# Patient Record
Sex: Male | Born: 1965 | Race: White | Hispanic: No | Marital: Single | Smoking: Current every day smoker
Health system: Southern US, Community
[De-identification: ages and names within clinical notes are randomized; demographics above are authoritative.]

## PROBLEM LIST (undated history)

## (undated) DIAGNOSIS — I10 Essential (primary) hypertension: Secondary | ICD-10-CM

## (undated) DIAGNOSIS — E781 Pure hyperglyceridemia: Secondary | ICD-10-CM

## (undated) DIAGNOSIS — R7303 Prediabetes: Secondary | ICD-10-CM

## (undated) DIAGNOSIS — J189 Pneumonia, unspecified organism: Secondary | ICD-10-CM

## (undated) DIAGNOSIS — I219 Acute myocardial infarction, unspecified: Secondary | ICD-10-CM

## (undated) DIAGNOSIS — I509 Heart failure, unspecified: Secondary | ICD-10-CM

## (undated) DIAGNOSIS — E7800 Pure hypercholesterolemia, unspecified: Secondary | ICD-10-CM

## (undated) HISTORY — DX: Pure hyperglyceridemia: E78.1

## (undated) HISTORY — DX: Pure hypercholesterolemia, unspecified: E78.00

## (undated) HISTORY — PX: SHOULDER SURGERY: SHX246

## (undated) HISTORY — PX: CARDIAC CATHETERIZATION: SHX172

## (undated) HISTORY — DX: Prediabetes: R73.03

## (undated) HISTORY — PX: BACK SURGERY: SHX140

## (undated) HISTORY — DX: Heart failure, unspecified: I50.9

## (undated) HISTORY — PX: OPEN REDUCTION INTERNAL FIXATION (ORIF) TIBIA/FIBULA FRACTURE: SHX5992

---

## 2004-03-13 ENCOUNTER — Ambulatory Visit: Payer: Self-pay | Admitting: Orthopedic Surgery

## 2004-05-09 ENCOUNTER — Encounter: Admission: RE | Admit: 2004-05-09 | Discharge: 2004-05-09 | Payer: Self-pay | Admitting: Neurological Surgery

## 2004-06-04 ENCOUNTER — Ambulatory Visit (HOSPITAL_COMMUNITY): Admission: RE | Admit: 2004-06-04 | Discharge: 2004-06-04 | Payer: Self-pay | Admitting: Neurological Surgery

## 2004-06-04 IMAGING — CT CT CHEST W/ CM
1 series · 16 of 31 positions shown, 20 images · IV contrast ([ID] [id number])
Comparison: none

CLINICAL DATA: Right upper lobe nodule on recent chest radiograph.

[Series 2: routine chest · axial · 0.75mm/px · z∈[-387,-82]mm · 16 of 67 slices shown, 20 images]
[im 3/67  mediastinal]
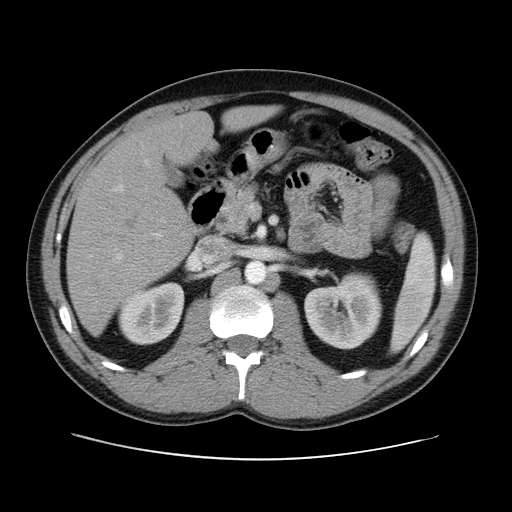
[im 3/67  lung]
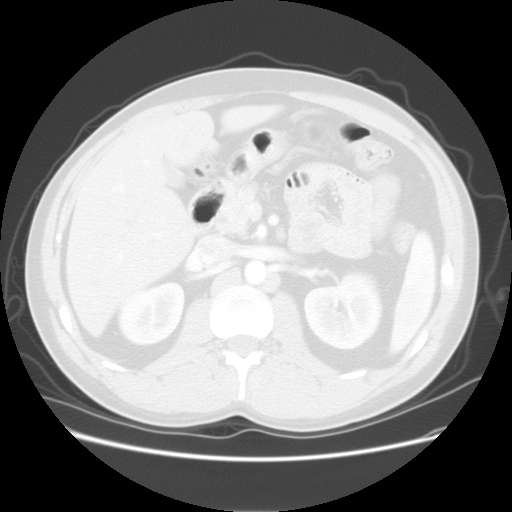
[im 8/67  lung]
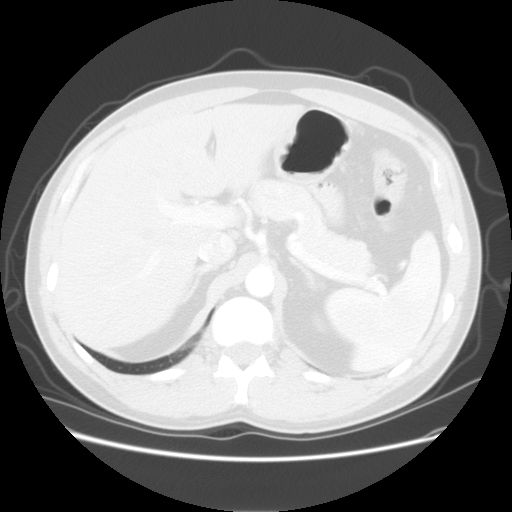
[im 13/67  lung]
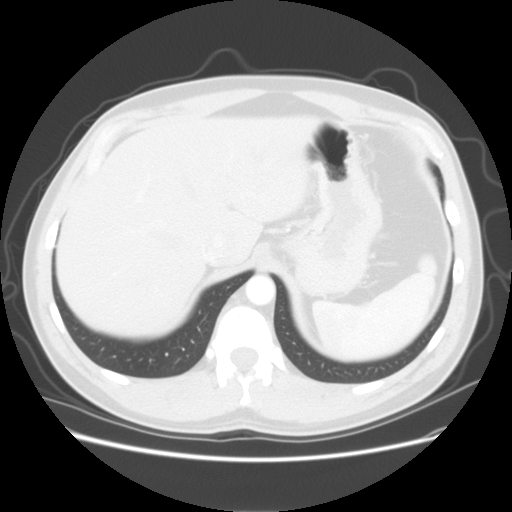
[im 15/67  lung]
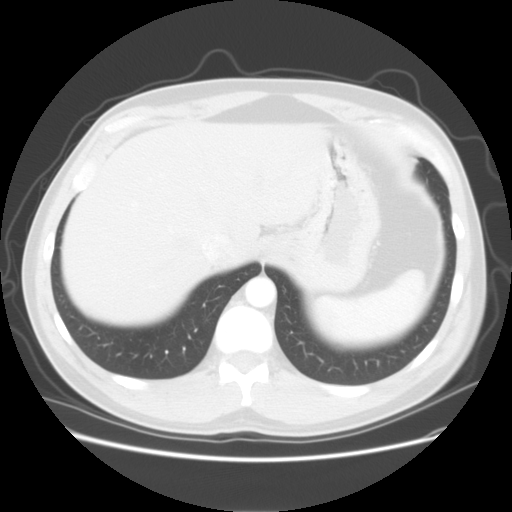
[im 20/67  mediastinal]
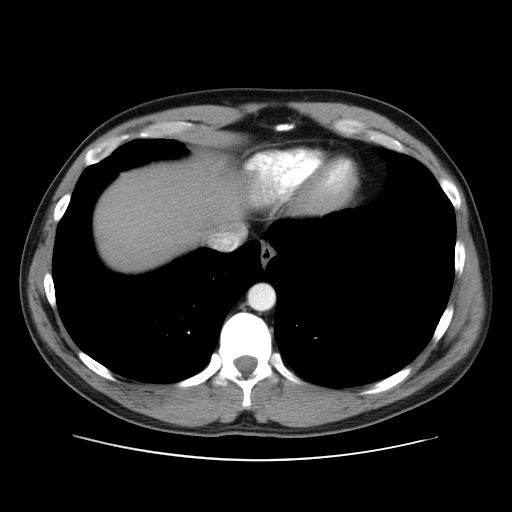
[im 20/67  lung]
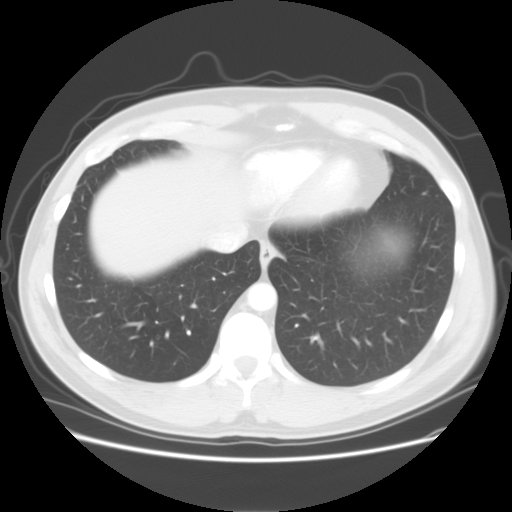
[im 23/67  lung]
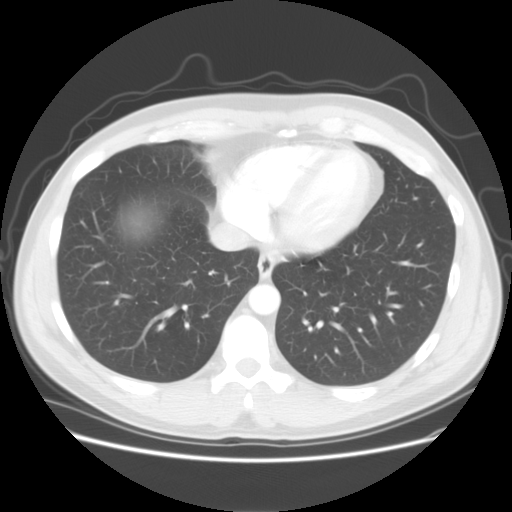
[im 27/67  lung]
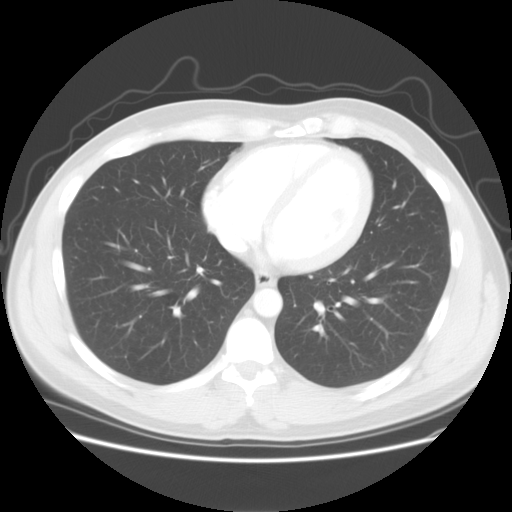
[im 32/67  lung]
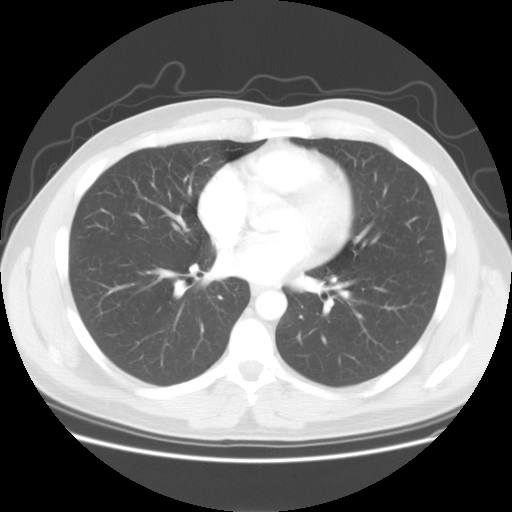
[im 36/67  mediastinal]
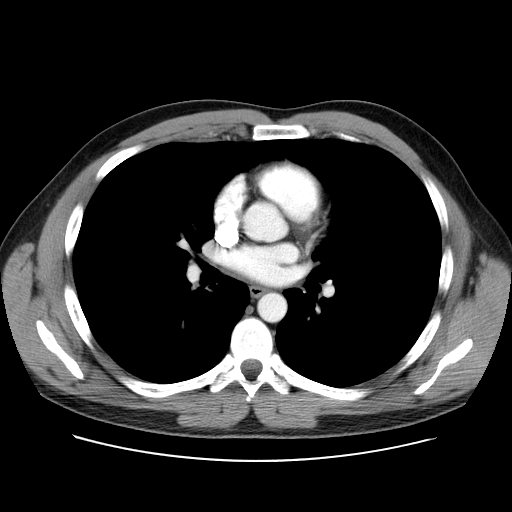
[im 36/67  lung]
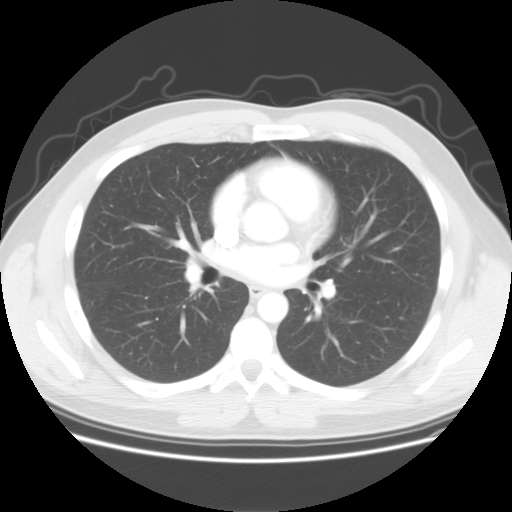
[im 40/67  lung]
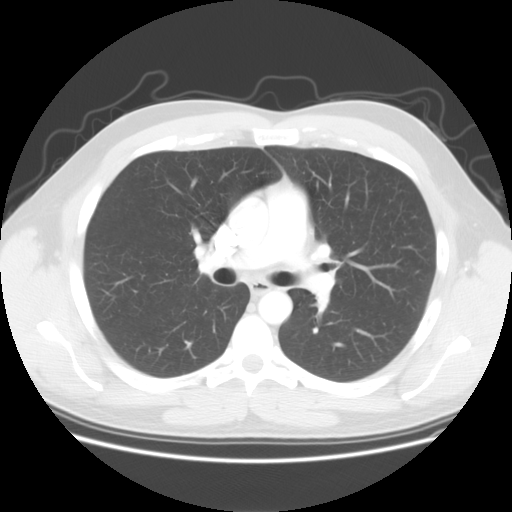
[im 45/67  lung]
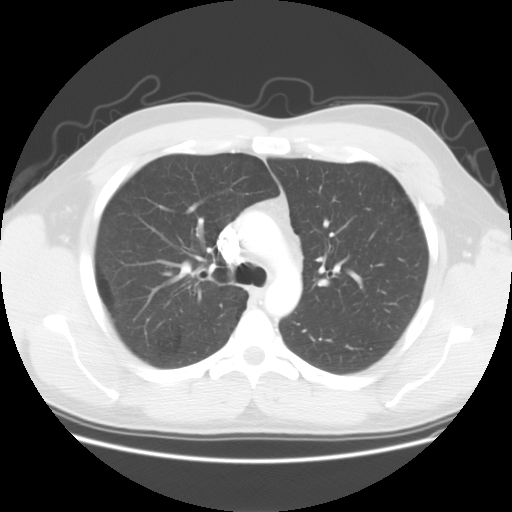
[im 47/67  lung]
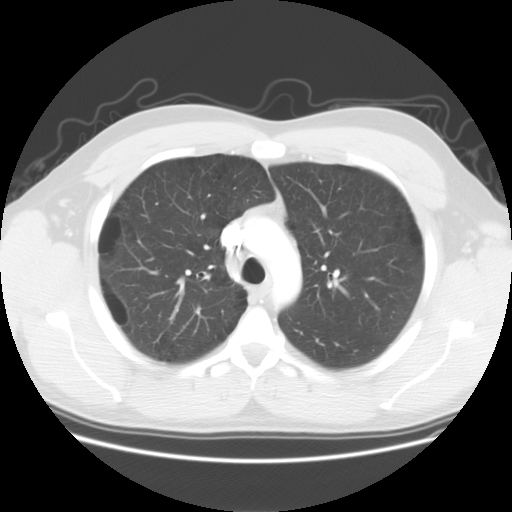
[im 52/67  mediastinal]
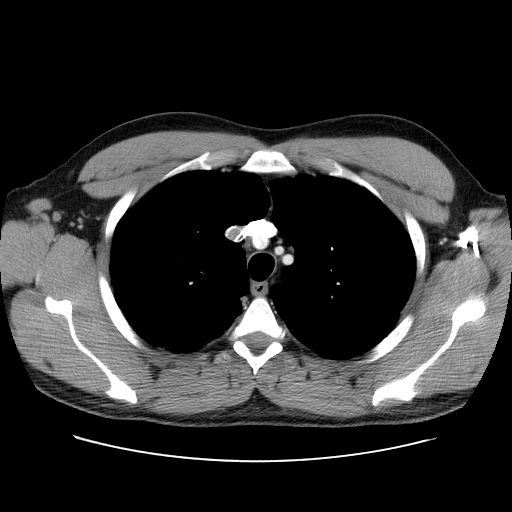
[im 52/67  lung]
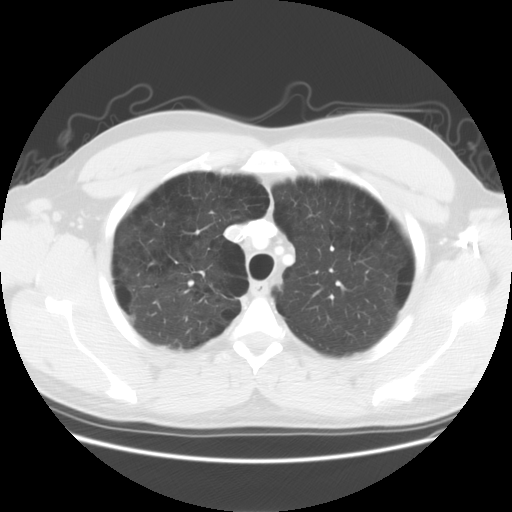
[im 54/67  lung]
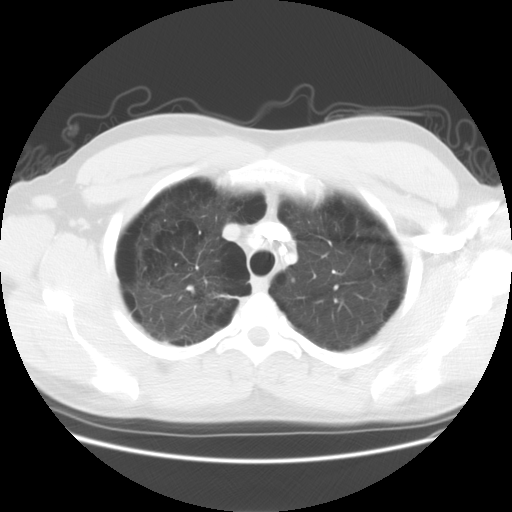
[im 59/67  lung]
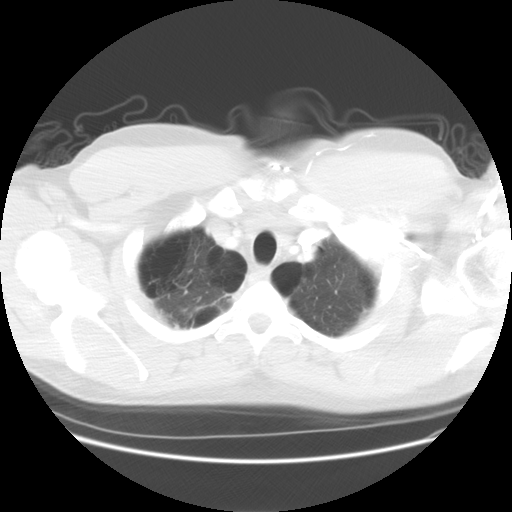
[im 64/67  lung]
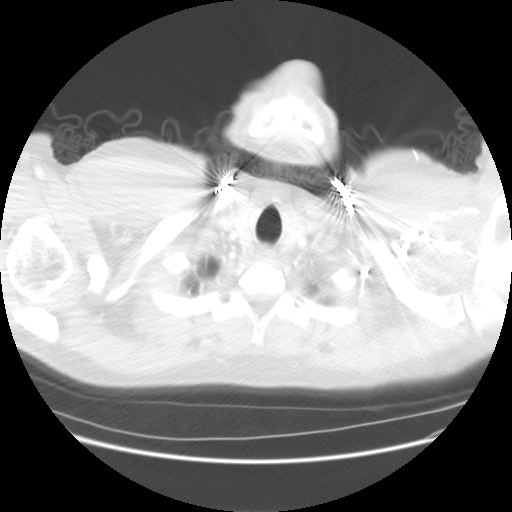

[16 of 31 positions shown; findings below may reference images not displayed]

CT chest with contrast:

Multidetector helical CT after 100 ml [VX] IV. No previous for
comparison. Moderate emphysematous changes involving primarily the upper lobes 
with multiple subpleural blebs noted towards the apices. Negative for nodule,
mass, or focal infiltrate. No pleural or pericardial effusion. No hilar or
mediastinal adenopathy. Visualized portions of upper abdomen including adrenal
glands unremarkable.
IMPRESSION: 1. Emphysematous changes in both upper lobes without focal lesion.

## 2004-06-05 ENCOUNTER — Ambulatory Visit (HOSPITAL_COMMUNITY): Admission: RE | Admit: 2004-06-05 | Discharge: 2004-06-05 | Payer: Self-pay | Admitting: Neurological Surgery

## 2004-07-15 ENCOUNTER — Encounter: Admission: RE | Admit: 2004-07-15 | Discharge: 2004-07-15 | Payer: Self-pay | Admitting: Neurological Surgery

## 2010-07-19 NOTE — Op Note (Signed)
NAME:  RONNY, KORFF            ACCOUNT NO.:  [id number]   MEDICAL RECORD NO.:  [id number]          PATIENT TYPE:  OIB   LOCATION:  2899                         FACILITY:  [facility]   PHYSICIAN:  Tia Alert, MD     DATE OF BIRTH:  1965/10/23   DATE OF PROCEDURE:  06/05/2004  DATE OF DISCHARGE:                                 OPERATIVE REPORT   PREOPERATIVE DIAGNOSES:  Lumbar disk herniation, L5-S1 on the left with  lateral recess stenosis, L4-5 on the left with left leg pain.   POSTOPERATIVE DIAGNOSES:  Lumbar disk herniation, L5-S1 on the left with  lateral recess stenosis, L4-5 on the left with left leg pain.   OPERATION PERFORMED:  1.  Decompressive lumbar hemilaminectomy, medial facetectomy and      foraminotomy, L4-5 on the left.  2.  Decompressive lumbar laminectomy, medial facetectomy and foraminotomy,      L5-S1 on the left with microdiskectomy of L5-S1 on the left utilizing      microscopic dissection.   SURGEON:  Tia Alert, MD   ASSISTANT:  Izell [place]. Elesa Hacker, M.D.   ANESTHESIA:  General endotracheal.   COMPLICATIONS:  None apparent.   INDICATIONS FOR PROCEDURE:  Mr. Stapel is a 45 year old white male who was  referred with left leg pain.  He had tried medical management for quite some  time without significant relief.  He had an MRI which showed a small disk  herniation of L5-S1 on the left with lateral recess stenosis at L4-5 on the  left.  He did well with a nerve root block initially, but the pain then  returned.  We recommended a lumbar decompressive hemilaminectomy at L4-5 and  L5-S1 with microdiskectomy at L5-S1 on the left.  He understood the risks,  benefits and alternatives and wished to proceed.   DESCRIPTION OF PROCEDURE:  The patient was taken to the operating room and  after induction of adequate general endotracheal anesthesia, he was rolled  into the prone position over the Wilson frame and all pressure points were  padded.  The lumbar  region was prepped with DuraPrep and then draped in the  usual sterile fashion.  5 mL of local anesthesia was injected and a dorsal  midline incision was made and carried down to the lumbosacral fascia.  The  fascia was opened and the paraspinous musculature was taken down in a  subperiosteal fashion to expose L4-5 and L5-S1 on the left side.  Intraoperative x-ray confirmed our level and then a hemilaminectomy, medial  facetectomy and foraminotomy was performed at L4-5 and at L5-S1 on the left  side utilizing the Kerrison punch.  The yellow ligament was identified and  opened and removed, exposing the underlying dura, L5 and S1 nerve roots.  We  decompressed out to the medial pedicle wall and made sure the nerve root at  each level was well decompressed.  Once the decompression was complete, I  brought in the operating microscope, retracted the nerve root medially at L4-  5, coagulated the epidural venous vasculature, cut this sharply and  inspected the disk.  I found  no significant disk herniation.  The nerve root  was free.  Therefore, I lined this with Gelfoam and went to the L5-S1 level.  I then retracted this nerve root medially and once again coagulated the  epidural venous vasculature, cut it sharply and found a fairly significant  subannular disk bulge.  Therefore we cut this disk space and performed a  thorough intradiskal diskectomy utilizing pituitary rongeurs and curettes.  Once the diskectomy was complete, I palpated once again with a coronary  dilator and a nerve hook into the midline and into the foramen to assure  adequate decompression.  I then irrigated with copious amounts of bacitracin  containing saline solution, removed the retractor, closed the fascia with  interrupted #1 Vicryl, closed the subcutaneous and subcuticular tissue with  2-0 and 3-0 Vicryl and closed the skin with Dermabond.  The drapes were  removed.  A sterile dressing was applied.  The patient was  awakened from  general anesthesia and transported to the recovery room in stable condition.  At the end of the procedure, all sponge, needle and instrument counts were  correct.      DSJ/MEDQ  D:  06/05/2004  T:  06/05/2004  Job:  [id number]

## 2015-08-29 DIAGNOSIS — F419 Anxiety disorder, unspecified: Secondary | ICD-10-CM

## 2015-08-29 DIAGNOSIS — I10 Essential (primary) hypertension: Secondary | ICD-10-CM

## 2015-08-29 HISTORY — DX: Anxiety disorder, unspecified: F41.9

## 2015-08-29 HISTORY — DX: Essential (primary) hypertension: I10

## 2015-09-06 DIAGNOSIS — M25562 Pain in left knee: Secondary | ICD-10-CM | POA: Insufficient documentation

## 2015-09-10 DIAGNOSIS — E782 Mixed hyperlipidemia: Secondary | ICD-10-CM | POA: Insufficient documentation

## 2015-09-10 HISTORY — DX: Mixed hyperlipidemia: E78.2

## 2017-10-30 DIAGNOSIS — J22 Unspecified acute lower respiratory infection: Secondary | ICD-10-CM | POA: Insufficient documentation

## 2018-03-03 HISTORY — PX: SHOULDER ARTHROSCOPY: SHX128

## 2019-04-08 DIAGNOSIS — R7303 Prediabetes: Secondary | ICD-10-CM | POA: Insufficient documentation

## 2020-03-14 DIAGNOSIS — I2510 Atherosclerotic heart disease of native coronary artery without angina pectoris: Secondary | ICD-10-CM | POA: Insufficient documentation

## 2020-03-14 HISTORY — DX: Atherosclerotic heart disease of native coronary artery without angina pectoris: I25.10

## 2020-03-29 DIAGNOSIS — Z955 Presence of coronary angioplasty implant and graft: Secondary | ICD-10-CM | POA: Insufficient documentation

## 2020-03-29 DIAGNOSIS — I252 Old myocardial infarction: Secondary | ICD-10-CM

## 2020-03-29 HISTORY — DX: Old myocardial infarction: I25.2

## 2020-03-29 HISTORY — DX: Presence of coronary angioplasty implant and graft: Z95.5

## 2020-09-19 DIAGNOSIS — L989 Disorder of the skin and subcutaneous tissue, unspecified: Secondary | ICD-10-CM | POA: Insufficient documentation

## 2021-01-23 DIAGNOSIS — R079 Chest pain, unspecified: Secondary | ICD-10-CM

## 2021-09-10 DIAGNOSIS — G8929 Other chronic pain: Secondary | ICD-10-CM | POA: Insufficient documentation

## 2022-01-20 DIAGNOSIS — M75102 Unspecified rotator cuff tear or rupture of left shoulder, not specified as traumatic: Secondary | ICD-10-CM | POA: Insufficient documentation

## 2022-02-16 ENCOUNTER — Emergency Department (HOSPITAL_BASED_OUTPATIENT_CLINIC_OR_DEPARTMENT_OTHER): Payer: Managed Care, Other (non HMO)

## 2022-02-16 ENCOUNTER — Emergency Department (HOSPITAL_BASED_OUTPATIENT_CLINIC_OR_DEPARTMENT_OTHER)
Admission: EM | Admit: 2022-02-16 | Discharge: 2022-02-16 | Disposition: A | Discharge status: Home / Self Care | Payer: Managed Care, Other (non HMO) | Attending: Emergency Medicine | Admitting: Emergency Medicine

## 2022-02-16 ENCOUNTER — Other Ambulatory Visit: Payer: Self-pay

## 2022-02-16 ENCOUNTER — Encounter (HOSPITAL_BASED_OUTPATIENT_CLINIC_OR_DEPARTMENT_OTHER): Payer: Self-pay | Admitting: Emergency Medicine

## 2022-02-16 DIAGNOSIS — J069 Acute upper respiratory infection, unspecified: Secondary | ICD-10-CM | POA: Insufficient documentation

## 2022-02-16 DIAGNOSIS — R079 Chest pain, unspecified: Secondary | ICD-10-CM | POA: Diagnosis present

## 2022-02-16 DIAGNOSIS — I10 Essential (primary) hypertension: Secondary | ICD-10-CM | POA: Insufficient documentation

## 2022-02-16 DIAGNOSIS — Z1152 Encounter for screening for COVID-19: Secondary | ICD-10-CM | POA: Insufficient documentation

## 2022-02-16 DIAGNOSIS — I2510 Atherosclerotic heart disease of native coronary artery without angina pectoris: Secondary | ICD-10-CM | POA: Insufficient documentation

## 2022-02-16 HISTORY — DX: Acute myocardial infarction, unspecified: I21.9

## 2022-02-16 HISTORY — DX: Pneumonia, unspecified organism: J18.9

## 2022-02-16 HISTORY — DX: Essential (primary) hypertension: I10

## 2022-02-16 LAB — CBC
HCT: 44.2 % (ref 39.0–52.0)
Hemoglobin: 15.4 g/dL (ref 13.0–17.0)
MCH: 32 pg (ref 26.0–34.0)
MCHC: 34.8 g/dL (ref 30.0–36.0)
MCV: 91.7 fL (ref 80.0–100.0)
Platelets: 183 10*3/uL (ref 150–400)
RBC: 4.82 MIL/uL (ref 4.22–5.81)
RDW: 12.8 % (ref 11.5–15.5)
WBC: 7.9 10*3/uL (ref 4.0–10.5)
nRBC: 0 % (ref 0.0–0.2)

## 2022-02-16 LAB — BASIC METABOLIC PANEL
Anion gap: 7 (ref 5–15)
BUN: 21 mg/dL — ABNORMAL HIGH (ref 6–20)
CO2: 26 mmol/L (ref 22–32)
Calcium: 8.9 mg/dL (ref 8.9–10.3)
Chloride: 102 mmol/L (ref 98–111)
Creatinine, Ser: 1 mg/dL (ref 0.61–1.24)
GFR, Estimated: >60 mL/min (ref >60)
Glucose, Bld: 108 mg/dL — ABNORMAL HIGH (ref 70–99)
Potassium: 3.8 mmol/L (ref 3.5–5.1)
Sodium: 135 mmol/L (ref 135–145)

## 2022-02-16 LAB — RESP PANEL BY RT-PCR (RSV, FLU A&B, COVID)  RVPGX2
Influenza A by PCR: NEGATIVE
Influenza B by PCR: NEGATIVE
Resp Syncytial Virus by PCR: NEGATIVE
SARS Coronavirus 2 by RT PCR: NEGATIVE

## 2022-02-16 LAB — TROPONIN I (HIGH SENSITIVITY)
Troponin I (High Sensitivity): 2 ng/L (ref <18)
Troponin I (High Sensitivity): 2 ng/L (ref <18)
Troponin I (High Sensitivity): 3 ng/L (ref <18)

## 2022-02-16 NOTE — ED Notes (Signed)
ED Provider at bedside. 

## 2022-02-16 NOTE — ED Triage Notes (Signed)
Pt reports mid CP, describes as burning; LT neck  and shoulder pain; sts it feels similar to when he had MI 2 yrs ago

## 2022-02-16 NOTE — ED Provider Notes (Signed)
  [facility] EMERGENCY DEPARTMENT Provider Note   CSN: [id number] Arrival date & time: 02/16/22  1948     History {Add pertinent medical, surgical, social history, OB history to HPI:1} Chief Complaint  Patient presents with   Chest Pain    Timothy Saunders is a 56 y.o. male.  Seveeral days chest burning no othe rmi sxs Also with cough and runny nose fatigus no fev No other sick contacts Unsure if exertional       Home Medications Prior to Admission medications   Not on File      Allergies    Patient has no known allergies.    Review of Systems   Review of Systems  Physical Exam Updated Vital Signs BP 118/64   Pulse 71   Temp 97.8 F (36.6 C) (Oral)   Resp 11   Ht 6\' 1"  (1.854 m)   Wt 93 kg   SpO2 94%   BMI 27.05 kg/m  Physical Exam  ED Results / Procedures / Treatments   Labs (all labs ordered are listed, but only abnormal results are displayed) Labs Reviewed  BASIC METABOLIC PANEL - Abnormal; Notable for the following components:      Result Value   Glucose, Bld 108 (*)    BUN 21 (*)    All other components within normal limits  RESP PANEL BY RT-PCR (RSV, FLU A&B, COVID)  RVPGX2  CBC  TROPONIN I (HIGH SENSITIVITY)    EKG EKG Interpretation  Date/Time:  Sunday February 16 2022 19:57:00 EST Ventricular Rate:  77 PR Interval:  156 QRS Duration: 96 QT Interval:  366 QTC Calculation: 414 R Axis:   72 Text Interpretation: Normal sinus rhythm Normal ECG When compared with ECG of 31-May-2004 12:57, PREVIOUS ECG IS PRESENT Confirmed by 02-Jun-2004 [phone]) on 02/16/2022 8:12:13 PM  Radiology DG Chest Port 1 View  Result Date: 02/16/2022 CLINICAL DATA:  Chest pain EXAM: PORTABLE CHEST 1 VIEW COMPARISON:  05/31/2004 FINDINGS: No acute airspace disease or effusion. Normal cardiac size. No pneumothorax IMPRESSION: No active disease. Electronically Signed   By: 06/02/2004 M.D.   On: 02/16/2022 20:32    Procedures Procedures   {Document cardiac monitor, telemetry assessment procedure when appropriate:1}  Medications Ordered in ED Medications - No data to display  ED Course/ Medical Decision Making/ A&P                           Medical Decision Making Amount and/or Complexity of Data Reviewed Labs: ordered. Radiology: ordered.   ***  {Document critical care time when appropriate:1} {Document review of labs and clinical decision tools ie heart score, Chads2Vasc2 etc:1}  {Document your independent review of radiology images, and any outside records:1} {Document your discussion with family members, caretakers, and with consultants:1} {Document social determinants of health affecting pt's care:1} {Document your decision making why or why not admission, treatments were needed:1} Final Clinical Impression(s) / ED Diagnoses Final diagnoses:  None    Rx / DC Orders ED Discharge Orders     None

## 2022-02-16 NOTE — Discharge Instructions (Addendum)
You were seen in the emergency department for your chest pain, cough, and fatigue.  It is likely that your symptoms are from an upper respiratory tract infection.  Please take Tylenol and over-the-counter cold medication for your symptoms.  You may also take over-the-counter antacids for any burning in your chest that may have.  Please follow-up with your primary doctor in 2 to 3 days and your cardiologist about your symptoms.  Return immediately if you experience any of the following: Unexplained sweats, vomiting, worsening chest discomfort, or any other concerning symptoms.

## 2022-03-26 HISTORY — PX: SHOULDER SURGERY: SHX246

## 2022-10-07 DIAGNOSIS — S46212A Strain of muscle, fascia and tendon of other parts of biceps, left arm, initial encounter: Secondary | ICD-10-CM | POA: Insufficient documentation

## 2022-10-15 HISTORY — PX: SHOULDER ARTHROSCOPY: SHX128

## 2022-10-15 HISTORY — PX: BICEPS TENDON REPAIR: SHX566

## 2023-05-15 DIAGNOSIS — F5101 Primary insomnia: Secondary | ICD-10-CM | POA: Insufficient documentation

## 2023-06-23 ENCOUNTER — Encounter: Payer: Self-pay | Admitting: *Deleted

## 2023-06-23 DIAGNOSIS — I509 Heart failure, unspecified: Secondary | ICD-10-CM | POA: Insufficient documentation

## 2023-07-01 ENCOUNTER — Other Ambulatory Visit: Payer: Self-pay

## 2023-07-01 ENCOUNTER — Ambulatory Visit

## 2023-07-01 VITALS — BP 124/80 | HR 67 | Ht 73.0 in | Wt 213.0 lb

## 2023-07-01 DIAGNOSIS — F17200 Nicotine dependence, unspecified, uncomplicated: Secondary | ICD-10-CM

## 2023-07-01 DIAGNOSIS — E782 Mixed hyperlipidemia: Secondary | ICD-10-CM

## 2023-07-01 DIAGNOSIS — I2510 Atherosclerotic heart disease of native coronary artery without angina pectoris: Secondary | ICD-10-CM

## 2023-07-01 DIAGNOSIS — I10 Essential (primary) hypertension: Secondary | ICD-10-CM

## 2023-07-01 DIAGNOSIS — G4730 Sleep apnea, unspecified: Secondary | ICD-10-CM | POA: Insufficient documentation

## 2023-07-01 NOTE — Assessment & Plan Note (Signed)
 Well-controlled. Target below 130/80 mmHg. Continue with lisinopril hydrochlorothiazide combination 20 mg - 12.5 mg once daily.

## 2023-07-01 NOTE — Progress Notes (Signed)
 Cardiology Consultation:    Date:  07/01/2023   ID:  ENO DENECKE, DOB 1965/12/04, MRN [id number]  PCP:  Spero Dye, PA-C  Cardiologist:  Daymon Evans Mccabe Gloria, MD   Referring MD: Spero Dye, PA-C   Chief Complaint  Patient presents with   Hypertension     ASSESSMENT AND PLAN:   Mr. Timothy Saunders 58 year old male patient history of hypertension, prior history of CAD s/p NSTEMI and underwent PCI of mid RCA 99% stenosis with drug-eluting stent while in [place] , echocardiogram at the time with normal biventricular function LVEF 55 to 60%, and with repeat chest pain in January 2022 on returning to [place]  he underwent stress echocardiogram at [facility] January 2022 that was negative, last stress test was normal 2022 at [facility] that showed no ischemia, CHF, hyperlipidemia, prediabetes, smoking, suspicion for sleep apnea based on sleep disturbances reported.  Has had no follow-up with cardiologist since 2022.  Here to establish care.  There is mention of CHF in his problem list however he does not have any clinical signs or symptoms of heart failure and will remove this from his problem list.   Problem List Items Addressed This Visit     Coronary artery disease involving native coronary artery of native heart without angina pectoris   S/p NSTEMI and PCI of RCA while in [place]  January 2022. Subsequent stress test January 2022 with echocardiogram reported normal. Stress test with nuclear imaging at [facility] November 2022 normal.  Remains asymptomatic. Good functional status. Encouraged to start exercising 4-5 times a day 30 minutes of brisk walking or low to moderate intensity activity as tolerated.  Continue with aspirin 81 mg once daily Continue with atorvastatin 80 mg once daily. Since it has been over 2 years since his prior MI okay to hold off on beta-blockers.  Will obtain transthoracic echocardiogram to  assess cardiac structure and function for baseline assessment       Essential hypertension - Primary   Well-controlled. Target below 130/80 mmHg. Continue with lisinopril hydrochlorothiazide combination 20 mg - 12.5 mg once daily.       Relevant Orders   EKG 12-Lead (Completed)   ECHOCARDIOGRAM COMPLETE   Mixed hyperlipidemia   Continue with atorvastatin 80 mg once daily. Lipid panel from April 2025 total cholesterol 153, triglycerides 237, HDL 35, LDL 85. Continue with dietary modifications.       Smoking addiction   Discussed about quitting smoking. Is had some success using nicotine patches in the past for off for some.. Is willing to try this again.  He is willing to get started on this process by himself and was able to wean himself and will let us  know if he wants any further assistance.       Sleep apnea   Suspicion for sleep apnea based on sleep disturbances and snoring. Recommend further evaluation for sleep apnea through his PCP.       Return to clinic in 1 year or as needed.   History of Present Illness:    Timothy Saunders is a 58 y.o. male who is being seen today for the evaluation of coronary artery disease at the request of Nodal, Ulysees Gander, PA-C.  Has history of hypertension, prior history of CAD s/p NSTEMI and underwent PCI of mid RCA 99% stenosis with drug-eluting stent while in [place] , echocardiogram at the time with normal biventricular function LVEF 55 to 60%, and with repeat chest pain in January 2022  on returning to [place]  he underwent stress echocardiogram at [facility] January 2022 that was negative, last stress test was normal 2022 at [facility] that showed no ischemia, CHF, hyperlipidemia, prediabetes, smoking, suspicion for sleep apnea based on sleep disturbances reported.  Here for the visit today accompanied by his wife.  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  Work does involve walking extended.  Good functional status at baseline.  Not regularly exercising since his shoulder injuries requiring surgeries. No active cardiac symptoms. Has had atypical chest and epigastric discomfort and burning-like notably triggered with food.  Currently pending further GI evaluation.  Good compliance with medications.  Blood pressures well-controlled. Does not routinely check blood pressures at home but has an upper arm cuff to use if needed.  Does continue to smoke up to a pack a day.  Has tried quitting in the past and has used nicotine patches to help himself wean off is motivated to do this again. Does report sleep disturbance and wife notes snoring.  EKG in the clinic today shows sinus rhythm heart rate 67/min, normal PR interval, QRS duration 94 ms, QTc normal 4 5 ms, no ischemic changes of the ST-T segment.  Prior stress test from November 2022 at [facility] reported no ischemia.  EF reported 62%.  Was done in the setting of chest pain evaluation at [facility] with negative troponins.  Recent blood work from June 18, 2023 hemoglobin A1c 6, prediabetes Lipid panel with total cholesterol 153, triglycerides 237, HDL 35, LDL 85. BMP unremarkable with BUN 15, creatinine 1.09 and EGFR 79   Past Medical History:  Diagnosis Date   Anxiety 08/29/2015   Congestive heart failure (CHF) (HCC)    Coronary artery disease involving native coronary artery of native heart without angina pectoris 03/14/2020   NSTEMI with 3.0 x 12 mm Resolute Onyx DES to RCA on 03/14/2020 in   [place] , normal EF.     Essential hypertension 08/29/2015   H/O non-ST elevation myocardial infarction (NSTEMI) 03/29/2020   H/O right coronary artery stent placement 03/29/2020   Non-STEMI with 99% occlusion of mid RCA on 03/14/20 with drug-eluting stent     High triglycerides    Hypercholesterolemia     Hypertension    Mixed hyperlipidemia 09/10/2015   Myocardial infarction [facility])    Pneumonia    Prediabetes     Past Surgical History:  Procedure Laterality Date   BACK SURGERY     BICEPS TENDON REPAIR Left 10/15/2022   Tenodesis/Reattachment Tendon Biceps, Surgeon: Quirino Buckles, MD @[facility]  [facility] OR   CARDIAC CATHETERIZATION     Stent Placement   OPEN REDUCTION INTERNAL FIXATION (ORIF) TIBIA/FIBULA FRACTURE Right    SHOULDER ARTHROSCOPY Left 10/15/2022   Debridement performed by Haywood Lisle MD   SHOULDER ARTHROSCOPY Right 2020   With rotator cuff repair   SHOULDER SURGERY Left 03/26/2022   Shoulder Arthroscopy, Surgeon: Quirino Buckles, MD, [facility] OR    Current Medications: Current Meds  Medication Sig   aspirin 81 MG chewable tablet Chew 81 mg by mouth daily.   atorvastatin (LIPITOR) 80 MG tablet Take 1 tablet by mouth daily.   citalopram (CELEXA) 10 MG tablet Take 10 mg by mouth daily.   lisinopril-hydrochlorothiazide (ZESTORETIC) 20-12.5 MG tablet Take 1 tablet by mouth daily.   traZODone (DESYREL) 100 MG tablet Take 1 tablet by mouth at  bedtime.     Allergies:   Patient has no known allergies.   Social History   Socioeconomic History   Marital status: Single    Spouse name: Not on file   Number of children: Not on file   Years of education: Not on file   Highest education level: Not on file  Occupational History   Not on file  Tobacco Use   Smoking status: Every Day    Types: Cigarettes   Smokeless tobacco: Never  Vaping Use   Vaping status: Never Used  Substance and Sexual Activity   Alcohol use: Not Currently   Drug use: Never   Sexual activity: Not on file  Other Topics Concern   Not on file  Social History Narrative   Not on file   Social Drivers of Health   Financial Resource Strain: Not on file  Food Insecurity: Low Risk  (06/18/2023)   Received from [facility]   Hunger Vital Sign    Worried About Running Out of Food in the Last Year: Never true     Ran Out of Food in the Last Year: Never true  Transportation Needs: No Transportation Needs (06/18/2023)   Received from [facility]    In the past 12 months, has lack of reliable transportation kept you from medical appointments, meetings, work or from getting things needed for daily living? : No  Physical Activity: Not on file  Stress: Not on file  Social Connections: Not on file     Family History: The patient's family history includes Diabetes in his father and mother; Heart disease in his father and mother; Ulcerative colitis in his sister. ROS:   Please see the history of present illness.    All 14 point review of systems negative except as described per history of present illness.  EKGs/Labs/Other Studies Reviewed:    The following studies were reviewed today:   EKG:  EKG Interpretation Date/Time:  Wednesday July 01 2023 08:46:55 EDT Ventricular Rate:  67 PR Interval:  152 QRS Duration:  94 QT Interval:  384 QTC Calculation: 405 R Axis:   75  Text Interpretation: Normal sinus rhythm Normal ECG When compared with ECG of 16-Feb-2022 21:49, PREVIOUS ECG IS PRESENT Confirmed by Bertha Broad reddy [id number]) on 07/01/2023 9:05:41 AM    Recent Labs: No results found for requested labs within last 365 days.  Recent Lipid Panel No results found for: "CHOL", "TRIG", "HDL", "CHOLHDL", "VLDL", "LDLCALC", "LDLDIRECT"  Physical Exam:    VS:  BP 124/80   Pulse 67   Ht 6\' 1"  (1.854 m)   Wt 213 lb (96.6 kg)   SpO2 97%   BMI 28.10 kg/m     Wt Readings from Last 3 Encounters:  07/01/23 213 lb (96.6 kg)  06/18/23 216 lb (98 kg)  02/16/22 205 lb (93 kg)     GENERAL:  Well nourished, well developed in no acute distress NECK: No JVD; No carotid bruits CARDIAC: RRR, S1 and S2 present, no murmurs, no rubs, no gallops CHEST:  Clear to auscultation without rales, wheezing or rhonchi  Extremities: No pitting pedal edema. Pulses bilaterally symmetric with  radial 2+ and dorsalis pedis 2+ NEUROLOGIC:  Alert and oriented x 3  Medication Adjustments/Labs and Tests Ordered: Current medicines are reviewed at length with the patient today.  Concerns regarding medicines are outlined above.  Orders Placed This Encounter  Procedures   EKG 12-Lead   ECHOCARDIOGRAM COMPLETE   No orders of  the defined types were placed in this encounter.   Signed, Carissa Musick reddy Jayvier Burgher, MD, MPH, [facility]. 07/01/2023 10:08 AM    [facility]

## 2023-07-01 NOTE — Assessment & Plan Note (Signed)
 Continue with atorvastatin 80 mg once daily. Lipid panel from April 2025 total cholesterol 153, triglycerides 237, HDL 35, LDL 85. Continue with dietary modifications.

## 2023-07-01 NOTE — Assessment & Plan Note (Addendum)
 S/p NSTEMI and PCI of RCA while in [place]  January 2022. Subsequent stress test January 2022 with echocardiogram reported normal. Stress test with nuclear imaging at [facility] November 2022 normal.  Remains asymptomatic. Good functional status. Encouraged to start exercising 4-5 times a day 30 minutes of brisk walking or low to moderate intensity activity as tolerated.  Continue with aspirin 81 mg once daily Continue with atorvastatin 80 mg once daily. Since it has been over 2 years since his prior MI okay to hold off on beta-blockers.  Will obtain transthoracic echocardiogram to assess cardiac structure and function for baseline assessment

## 2023-07-01 NOTE — Patient Instructions (Signed)
 Medication Instructions:  Your physician recommends that you continue on your current medications as directed. Please refer to the Current Medication list given to you today.  *If you need a refill on your cardiac medications before your next appointment, please call your pharmacy*  Lab Work: None If you have labs (blood work) drawn today and your tests are completely normal, you will receive your results only by: MyChart Message (if you have MyChart) OR A paper copy in the mail If you have any lab test that is abnormal or we need to change your treatment, we will call you to review the results.  Testing/Procedures: Your physician has requested that you have an echocardiogram. Echocardiography is a painless test that uses sound waves to create images of your heart. It provides your doctor with information about the size and shape of your heart and how well your heart's chambers and valves are working. This procedure takes approximately one hour. There are no restrictions for this procedure. Please do NOT wear cologne, perfume, aftershave, or lotions (deodorant is allowed). Please arrive 15 minutes prior to your appointment time.  Please note: We ask at that you not bring children with you during ultrasound (echo/ vascular) testing. Due to room size and safety concerns, children are not allowed in the ultrasound rooms during exams. Our front office staff cannot provide observation of children in our lobby area while testing is being conducted. An adult accompanying a patient to their appointment will only be allowed in the ultrasound room at the discretion of the ultrasound technician under special circumstances. We apologize for any inconvenience.   Follow-Up: At [facility], you and your health needs are our priority.  As part of our continuing mission to provide you with exceptional heart care, our providers are all part of one team.  This team includes your primary Cardiologist  (physician) and Advanced Practice Providers or APPs (Physician Assistants and Nurse Practitioners) who all work together to provide you with the care you need, when you need it.  Your next appointment:   1 year(s)  Provider:   Bertha Broad, MD    We recommend signing up for the patient portal called "MyChart".  Sign up information is provided on this After Visit Summary.  MyChart is used to connect with patients for Virtual Visits (Telemedicine).  Patients are able to view lab/test results, encounter notes, upcoming appointments, etc.  Non-urgent messages can be sent to your provider as well.   To learn more about what you can do with MyChart, go to [email or web address].   Other Instructions Follow up with PCP for sleep apnea.

## 2023-07-01 NOTE — Assessment & Plan Note (Signed)
 Discussed about quitting smoking. Is had some success using nicotine patches in the past for off for some.. Is willing to try this again.  He is willing to get started on this process by himself and was able to wean himself and will let us  know if he wants any further assistance.

## 2023-07-01 NOTE — Assessment & Plan Note (Signed)
 Suspicion for sleep apnea based on sleep disturbances and snoring. Recommend further evaluation for sleep apnea through his PCP.

## 2023-07-09 ENCOUNTER — Ambulatory Visit (HOSPITAL_BASED_OUTPATIENT_CLINIC_OR_DEPARTMENT_OTHER)
Admission: RE | Admit: 2023-07-09 | Discharge: 2023-07-09 | Disposition: A | Discharge status: Home / Self Care | Source: Ambulatory Visit

## 2023-07-09 DIAGNOSIS — I2510 Atherosclerotic heart disease of native coronary artery without angina pectoris: Secondary | ICD-10-CM | POA: Insufficient documentation

## 2023-07-09 LAB — ECHOCARDIOGRAM COMPLETE
AR max vel: 2.56 cm2
AV Area VTI: 2.75 cm2
AV Area mean vel: 2.7 cm2
AV Mean grad: 5 mmHg
AV Peak grad: 11.2 mmHg
Ao pk vel: 1.67 m/s
Area-P 1/2: 3.34 cm2
Calc EF: 65.6 %
S' Lateral: 2.9 cm
Single Plane A2C EF: 66.7 %
Single Plane A4C EF: 66.6 %

## 2023-09-10 ENCOUNTER — Telehealth: Payer: Self-pay

## 2023-09-10 NOTE — Telephone Encounter (Signed)
   Pre-operative Risk Assessment    Patient Name: Timothy Saunders  DOB: 1965/12/15 MRN: [id number]   Date of last office visit: 07/01/23 Date of next office visit: N/A   Request for Surgical Clearance    Procedure:  colonoscopy/ EGD  Date of Surgery:  Clearance 09/21/23                                Surgeon:  Dr. Elray Surgeon's Group or Practice Name:  [facility] Phone number:  [phone] Fax number:  [phone]   Type of Clearance Requested:   - Pharmacy:  Hold Aspirin please advise   Type of Anesthesia:  propofol   Additional requests/questions:    SignedAnnabella LITTIE Sayres   09/10/2023, 4:36 PM

## 2023-09-11 NOTE — Telephone Encounter (Signed)
   Patient Name: Timothy Saunders  DOB: September 26, 1965 MRN: [id number]  Primary Cardiologist: None  Chart reviewed as part of pre-operative protocol coverage. Given past medical history and time since last visit, based on ACC/AHA guidelines, Timothy Saunders is at acceptable risk for the planned procedure without further cardiovascular testing.   Patient may hold aspirin for 5 days prior to the procedure and restart as soon as possible afterward at the surgeon's discretion.  The patient was advised that if he develops new symptoms prior to surgery to contact our office to arrange for a follow-up visit, and he verbalized understanding.  I will route this recommendation to the requesting party via Epic fax function and remove from pre-op pool.  Please call with questions.  Jerren Flinchbaugh, [place] 09/11/2023, 10:43 AM
